# Patient Record
Sex: Female | Born: 1995 | Race: White | Hispanic: No | Marital: Single | Smoking: Current some day smoker
Health system: Southern US, Community
[De-identification: ages and names within clinical notes are randomized; demographics above are authoritative.]

## PROBLEM LIST (undated history)

## (undated) DIAGNOSIS — G43909 Migraine, unspecified, not intractable, without status migrainosus: Secondary | ICD-10-CM

## (undated) HISTORY — PX: TONSILLECTOMY: SUR1361

## (undated) HISTORY — PX: HERNIA REPAIR: SHX51

---
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
## 2015-11-12 ENCOUNTER — Emergency Department (HOSPITAL_COMMUNITY)
Admission: EM | Admit: 2015-11-12 | Discharge: 2015-11-12 | Disposition: A | Discharge status: Home / Self Care | Payer: Self-pay | Attending: Emergency Medicine | Admitting: Emergency Medicine

## 2015-11-12 ENCOUNTER — Encounter (HOSPITAL_COMMUNITY): Payer: Self-pay | Admitting: Emergency Medicine

## 2015-11-12 DIAGNOSIS — F17210 Nicotine dependence, cigarettes, uncomplicated: Secondary | ICD-10-CM | POA: Insufficient documentation

## 2015-11-12 DIAGNOSIS — M62830 Muscle spasm of back: Secondary | ICD-10-CM | POA: Insufficient documentation

## 2015-11-12 HISTORY — DX: Migraine, unspecified, not intractable, without status migrainosus: G43.909

## 2015-11-12 MED ORDER — CYCLOBENZAPRINE HCL 5 MG PO TABS: 5.0000 mg | ORAL_TABLET | Freq: Two times a day (BID) | ORAL | 0 refills | Status: AC | PRN

## 2015-11-12 MED ORDER — IBUPROFEN 600 MG PO TABS: 600.0000 mg | ORAL_TABLET | Freq: Four times a day (QID) | ORAL | 0 refills | Status: AC | PRN

## 2015-11-12 MED ORDER — METHOCARBAMOL 500 MG PO TABS: 500.0000 mg | ORAL_TABLET | Freq: Two times a day (BID) | ORAL | 0 refills | Status: DC

## 2015-11-12 NOTE — ED Notes (Signed)
NP IN TO TALK WITH PT AND MOTHER.

## 2015-11-12 NOTE — ED Notes (Signed)
PT STATES SHE HAS TRIED FLEXERIL BEFORE, "IT  DOESN'T HELP". MOTHER ASKING TO SPEAK TO NP AGAIN.

## 2015-11-12 NOTE — ED Provider Notes (Signed)
[facility]EMERGENCY DEPT Provider Note   CSN: [id number] Arrival date & time: 11/12/15  1032  By signing my name below, I, Freida Busman, attest that this documentation has been prepared under the direction and in the presence of non-physician practitioner, Teressa Lower, NP. Electronically Signed: Freida Busman, Scribe. 11/12/2015. 10:46 AM.   History   Chief Complaint Chief Complaint  Patient presents with  . Back Pain   The history is provided by the patient. No language interpreter was used.     HPI Comments:  Melanie Beasley is a 20 y.o. female who presents to the Emergency Department complaining of worsening left upper back pain x 2 days. She denies acute injury/fall. No alleviating factors noted. Pt has no other complaints or symptoms at this time. Pain is along the scapula and it hurts when she moves her shoulder. Denies numbness or weakness.    Past Medical History:  Diagnosis Date  . Migraines     There are no active problems to display for this patient.   Past Surgical History:  Procedure Laterality Date  . HERNIA REPAIR    . TONSILLECTOMY      OB History    No data available       Home Medications    Prior to Admission medications   Not on File    Family History No family history on file.  Social History Social History  Substance Use Topics  . Smoking status: Current Some Day Smoker    Types: Cigarettes  . Smokeless tobacco: Never Used  . Alcohol use No     Allergies   Review of patient's allergies indicates no known allergies.   Review of Systems Review of Systems  Constitutional: Negative for chills and fever.  Respiratory: Negative for shortness of breath.   Cardiovascular: Negative for chest pain.  Musculoskeletal: Positive for back pain.  All other systems reviewed and are negative.    Physical Exam Updated Vital Signs BP 112/67 (BP Location: Right Arm)   Pulse 72   Temp 98.6 F (37 C) (Oral)   Resp 16   LMP  11/05/2015   SpO2 100%   Physical Exam  Constitutional: She is oriented to person, place, and time. She appears well-developed and well-nourished. No distress.  HENT:  Head: Normocephalic and atraumatic.  Eyes: Conjunctivae are normal.  Cardiovascular: Normal rate.   Pulmonary/Chest: Effort normal.  Abdominal: She exhibits no distension.  Musculoskeletal:  Tender along the left scapula. Mild swelling noted. Pulses intact  Neurological: She is alert and oriented to person, place, and time.  Skin: Skin is warm and dry.  Psychiatric: She has a normal mood and affect.  Nursing note and vitals reviewed.    ED Treatments / Results  DIAGNOSTIC STUDIES:  Oxygen Saturation is 100% on RA, normal by my interpretation.    COORDINATION OF CARE:  10:46 AM Discussed treatment plan with pt at bedside and pt agreed to plan.  Labs (all labs ordered are listed, but only abnormal results are displayed) Labs Reviewed - No data to display  EKG  EKG Interpretation None       Radiology No results found.  Procedures Procedures (including critical care time)  Medications Ordered in ED Medications - No data to display   Initial Impression / Assessment and Plan / ED Course  I have reviewed the triage vital signs and the nursing notes.  Pertinent labs & imaging results that were available during my care of the patient were reviewed by me and  considered in my medical decision making (see chart for details).  Clinical Course   Will treat with muscle relaxer and ibuprofen. Don't think any imaging is needed at this time   Final Clinical Impressions(s) / ED Diagnoses   Final diagnoses:  Muscle spasm of back    New Prescriptions New Prescriptions   No medications on file    I personally performed the services described in this documentation, which was scribed in my presence. The recorded information has been reviewed and is accurate.     Teressa LowerVrinda Derrick Tiegs, NP 11/12/15 1130     Shaune Pollackameron Isaacs, MD 11/12/15 1753

## 2015-11-26 ENCOUNTER — Encounter (INDEPENDENT_AMBULATORY_CARE_PROVIDER_SITE_OTHER): Payer: Self-pay | Admitting: Sports Medicine

## 2015-11-26 ENCOUNTER — Ambulatory Visit (INDEPENDENT_AMBULATORY_CARE_PROVIDER_SITE_OTHER): Payer: Self-pay

## 2015-11-26 ENCOUNTER — Ambulatory Visit (INDEPENDENT_AMBULATORY_CARE_PROVIDER_SITE_OTHER): Payer: Self-pay | Admitting: Sports Medicine

## 2015-11-26 VITALS — BP 105/70 | HR 64 | Ht 60.0 in | Wt 109.0 lb

## 2015-11-26 DIAGNOSIS — G8929 Other chronic pain: Secondary | ICD-10-CM

## 2015-11-26 DIAGNOSIS — Z716 Tobacco abuse counseling: Secondary | ICD-10-CM

## 2015-11-26 DIAGNOSIS — Z720 Tobacco use: Secondary | ICD-10-CM

## 2015-11-26 DIAGNOSIS — M25512 Pain in left shoulder: Secondary | ICD-10-CM

## 2015-11-26 MED ORDER — DICLOFENAC SODIUM 75 MG PO TBEC: 75.0000 mg | DELAYED_RELEASE_TABLET | Freq: Two times a day (BID) | ORAL | 0 refills | Status: AC

## 2015-11-26 MED ORDER — FAMOTIDINE 20 MG PO TABS: 20.0000 mg | ORAL_TABLET | Freq: Two times a day (BID) | ORAL | 0 refills | Status: AC

## 2015-11-26 NOTE — Progress Notes (Signed)
Melanie Beasley - 20 y.o. female MRN [id number]  Date of birth: 1995/09/04  Office Visit Note: Visit Date: 11/26/2015 PCP: No primary care provider on file. Referred by: No ref. provider found  Assessment & Plan: Visit Diagnoses:  1. Chronic left shoulder pain   2. Tobacco abuse counseling   3. Tobacco abuse     Plan:   conservative management warranted initially with anti-inflammatories, left shoulder conditioning program & relative rest. If any lack of improvement will consider further diagnostic testing versus empiric subacromial injection. Option for physical therapy discussed. .    Patient Instructions  Please think about quitting smoking.  This is very important for your health.  There are many things we can do to help you quit.  Let  us know if you are interested.  You can also call 1-800-QUIT-NOW [phone](1-786-016-9748-669) for free smoking cessation counseling.   Do your shoulder exercises.    Meds & Orders:  Meds ordered this encounter  Medications  . diclofenac (VOLTAREN) 75 MG EC tablet    Sig: Take 1 tablet (75 mg total) by mouth 2 (two) times daily. Take 1 tab bid X 14 days then as needed    Dispense:  60 tablet    Refill:  0  . famotidine (PEPCID) 20 MG tablet    Sig: Take 1 tablet (20 mg total) by mouth 2 (two) times daily.    Dispense:  60 tablet    Refill:  0    Orders Placed This Encounter  Procedures  . XR Shoulder Left    Follow-up: Return in about 4 weeks (around 12/24/2015) for repeat clinical exam.   Procedures: No procedures performed  No notes on file   Clinical History: No additional findings.  She reports that she has been smoking Cigarettes.  She has never used smokeless tobacco.  . Subjective: Chief Complaint  Patient presents with  . Left Shoulder - reinjury   HPI: Pt. States a month ago she thinks she may have reinjuried her Left shoulder. Patient did go to [facility] 2weeks after, but no x-rays were taken.  Hurts to raise Left arm above her  head.  When she does move shoulder, feels like a sharp pain, when not moving shoulder, its a dull pain. No neck pain.    ROS Otherwise per HPI.  Objective:  VS:  HT:5' (152.4 cm)   WT:109 lb (49.4 kg)  BMI:21.3    BP:105/70  HR:64bpm  TEMP: ( )  RESP:  Physical Exam  Constitutional: No distress.  HENT:  Head: Normocephalic and atraumatic.  Eyes: Right eye exhibits no discharge. Left eye exhibits no discharge. No scleral icterus.  Pulmonary/Chest: Effort normal. No respiratory distress.  Neurological: She is alert.  Appropriately interactive.  Skin: Skin is warm and dry. No rash noted. She is not diaphoretic. No erythema. No pallor.  Psychiatric: Judgment normal.    Left Shoulder Exam   Other  Erythema: absent Scars: absent Sensation: normal   Comments:  Overall well aligned shoulder. Limited shoulder abduction by pain. Small amount of crepitation with axial loading & circumduction but is present bilaterally but focally over the anterior shoulder on the left. Some periscapular crepitation as well. Paraspinal muscle tenderness is present in the periscapular region. Negative speeds test, negative Yergason's tests. Internal rotation, external rotation & empty can testing strength is intact. No focal bony tenderness. Radial pulses & sensation bilateral upper extremities intact.     Imaging: Xr Shoulder Left  Result Date: 11/27/2015 Findings: Well  aligned shoulder without evidence of acute fracture dislocation. No obvious osseous lesions. Normal soft tissue. Impression: Normal appearing left shoulder.   Past Medical/Family/Surgical/Social History: There are no active problems to display for this patient.  Past Medical History:  Diagnosis Date  . Migraines    Family History  Problem Relation Age of Onset  . Cancer Mother   . Diabetes Father   . High blood pressure Father   . High blood pressure Maternal Grandmother   . Heart disease Maternal Grandmother    Past  Surgical History:  Procedure Laterality Date  . HERNIA REPAIR    . TONSILLECTOMY     Social History   Occupational History  . Not on file.   Social History Main Topics  . Smoking status: Current Some Day Smoker    Types: Cigarettes  . Smokeless tobacco: Never Used  . Alcohol use No  . Drug use: No  . Sexual activity: Not on file

## 2015-11-26 NOTE — Patient Instructions (Signed)
Please think about quitting smoking.  This is very important for your health.  There are many things we can do to help you quit.  Let  us know if you are interested.  You can also call 1-800-QUIT-NOW [phone]([phone]) for free smoking cessation counseling.   Do your shoulder exercises.

## 2015-12-24 ENCOUNTER — Ambulatory Visit (INDEPENDENT_AMBULATORY_CARE_PROVIDER_SITE_OTHER): Payer: Self-pay | Admitting: Sports Medicine

## 2016-03-02 ENCOUNTER — Ambulatory Visit (HOSPITAL_COMMUNITY)
Admission: EM | Admit: 2016-03-02 | Discharge: 2016-03-02 | Disposition: A | Discharge status: Home / Self Care | Payer: BLUE CROSS/BLUE SHIELD | Attending: Family Medicine | Admitting: Family Medicine

## 2016-03-02 ENCOUNTER — Encounter (HOSPITAL_COMMUNITY): Payer: Self-pay | Admitting: Emergency Medicine

## 2016-03-02 DIAGNOSIS — H6121 Impacted cerumen, right ear: Secondary | ICD-10-CM

## 2016-03-02 DIAGNOSIS — J069 Acute upper respiratory infection, unspecified: Secondary | ICD-10-CM

## 2016-03-02 DIAGNOSIS — B9789 Other viral agents as the cause of diseases classified elsewhere: Secondary | ICD-10-CM | POA: Diagnosis not present

## 2016-03-02 DIAGNOSIS — R110 Nausea: Secondary | ICD-10-CM

## 2016-03-02 MED ORDER — ONDANSETRON 4 MG PO TBDP: 4.0000 mg | ORAL_TABLET | Freq: Three times a day (TID) | ORAL | 0 refills | Status: AC | PRN

## 2016-03-02 NOTE — ED Provider Notes (Signed)
CSN: [id number]     Arrival date & time 03/02/16  1710 History   First MD Initiated Contact with Patient 03/02/16 1857     Chief Complaint  Patient presents with  . Dizziness  . Headache  . Nausea  . Cough   (Consider location/radiation/quality/duration/timing/severity/associated sxs/prior Treatment) 21 year old patient presents to clinic with chief complaint of dizziness, nausea, and cough. She also reports headache, denies fever, denies congestion, denies feelings of pain or pressure in her ears. No sore throat, has vomited once today but several times yesterday.   The history is provided by the patient.  Dizziness  Associated symptoms: headaches   Headache  Associated symptoms: cough and dizziness   Cough  Associated symptoms: headaches     Past Medical History:  Diagnosis Date  . Migraines    Past Surgical History:  Procedure Laterality Date  . HERNIA REPAIR    . TONSILLECTOMY     Family History  Problem Relation Age of Onset  . Cancer Mother   . Diabetes Father   . High blood pressure Father   . High blood pressure Maternal Grandmother   . Heart disease Maternal Grandmother    Social History  Substance Use Topics  . Smoking status: Current Some Day Smoker    Packs/day: 0.50    Types: Cigarettes  . Smokeless tobacco: Never Used  . Alcohol use No   OB History    No data available     Review of Systems  Reason unable to perform ROS: as covered in HPI.  Respiratory: Positive for cough.   Neurological: Positive for dizziness and headaches.  All other systems reviewed and are negative.   Allergies  Patient has no known allergies.  Home Medications   Prior to Admission medications   Medication Sig Start Date End Date Taking? Authorizing Provider  cyclobenzaprine (FLEXERIL) 5 MG tablet Take 1 tablet (5 mg total) by mouth 2 (two) times daily as needed for muscle spasms. Patient not taking: Reported on 11/26/2015 11/12/15   Teressa Lower, NP   diclofenac (VOLTAREN) 75 MG EC tablet Take 1 tablet (75 mg total) by mouth 2 (two) times daily. Take 1 tab bid X 14 days then as needed 11/26/15   Andrena Mews, DO  famotidine (PEPCID) 20 MG tablet Take 1 tablet (20 mg total) by mouth 2 (two) times daily. 11/26/15   Andrena Mews, DO  ibuprofen (ADVIL,MOTRIN) 600 MG tablet Take 1 tablet (600 mg total) by mouth every 6 (six) hours as needed. Patient not taking: Reported on 11/26/2015 11/12/15   Teressa Lower, NP  methocarbamol (ROBAXIN) 500 MG tablet Take 1 tablet (500 mg total) by mouth 2 (two) times daily. Patient not taking: Reported on 11/26/2015 11/12/15   Teressa Lower, NP  ondansetron (ZOFRAN ODT) 4 MG disintegrating tablet Take 1 tablet (4 mg total) by mouth every 8 (eight) hours as needed for nausea or vomiting. 03/02/16   Dorena Bodo, NP   Meds Ordered and Administered this Visit  Medications - No data to display  BP 114/82 (BP Location: Right Arm)   Pulse 78   Temp 98.2 F (36.8 C) (Oral)   LMP 01/29/2016 (Approximate)   SpO2 100%  No data found.   Physical Exam  Constitutional: She is oriented to person, place, and time. She appears well-developed and well-nourished. No distress.  HENT:  Head: Normocephalic and atraumatic.  Right Ear: External ear normal.  Left Ear: Tympanic membrane and external ear normal.  Nose: Nose  normal.  Mouth/Throat: Oropharynx is clear and moist.  Right cerumen impaction  Eyes: EOM are normal. Pupils are equal, round, and reactive to light.  Neck: Normal range of motion. Neck supple.  Cardiovascular: Normal rate and regular rhythm.   Pulmonary/Chest: Effort normal and breath sounds normal.  Abdominal: Soft. Bowel sounds are normal.  Lymphadenopathy:    She has no cervical adenopathy.  Neurological: She is alert and oriented to person, place, and time.  Skin: Skin is warm and dry. Capillary refill takes less than 2 seconds. She is not diaphoretic.  Psychiatric: She has a  normal mood and affect.  Nursing note and vitals reviewed.   Urgent Care Course     Procedures (including critical care time)  Labs Review Labs Reviewed - No data to display  Imaging Review No results found.   Visual Acuity Review  Right Eye Distance:   Left Eye Distance:   Bilateral Distance:    Right Eye Near:   Left Eye Near:    Bilateral Near:         MDM   1. Nausea   2. Impacted cerumen of right ear   3. Viral upper respiratory tract infection   You most likely have a viral URI, I advise rest, plenty of fluids and management of symptoms with over the counter medicines. For symptoms you may take Tylenol as needed every 4-6 hours for body aches or fever, not to exceed 4,000 mg a day, Take mucinex or mucinex DM ever 12 hours with a full glass of water, you may use an inhaled steroid such as Flonase, 2 sprays each nostril once a day for congestion, or an antihistamine such as Claritin or Zyrtec once a day. Should your symptoms worsen or fail to resolve, follow up with your primary care provider or return to clinic.   Your dizziness and nausea could be related to the cerumen impaction in your right ear. We have removed the impaction here in the office.  I have sent a prescription to your pharmacy for Zofran for nausea, take 1 tablet under the tongue every 8 hours as needed.    Dorena BodoLawrence Amanie Mcculley, NP 03/02/16 1928

## 2016-03-02 NOTE — Discharge Instructions (Signed)
You most likely have a viral URI, I advise rest, plenty of fluids and management of symptoms with over the counter medicines. For symptoms you may take Tylenol as needed every 4-6 hours for body aches or fever, not to exceed 4,000 mg a day, Take mucinex or mucinex DM ever 12 hours with a full glass of water, you may use an inhaled steroid such as Flonase, 2 sprays each nostril once a day for congestion, or an antihistamine such as Claritin or Zyrtec once a day. Should your symptoms worsen or fail to resolve, follow up with your primary care provider or return to clinic.   Your dizziness and nausea could be related to the cerumen impaction in your right ear. We have removed the impaction here in the office.  I have sent a prescription to your pharmacy for Zofran for nausea, take 1 tablet under the tongue every 8 hours as needed.

## 2016-03-02 NOTE — ED Triage Notes (Signed)
Pt has been suffering from a cough, dizziness and a headache for 2-3 days.  Yesterday she started feeling nauseous.  Pt denies a fever at home.

## 2016-10-13 ENCOUNTER — Encounter (HOSPITAL_COMMUNITY): Payer: Self-pay | Admitting: Emergency Medicine

## 2016-10-13 ENCOUNTER — Emergency Department (HOSPITAL_COMMUNITY)
Admission: EM | Admit: 2016-10-13 | Discharge: 2016-10-13 | Disposition: A | Discharge status: Home / Self Care | Payer: BLUE CROSS/BLUE SHIELD | Attending: Emergency Medicine | Admitting: Emergency Medicine

## 2016-10-13 DIAGNOSIS — F17210 Nicotine dependence, cigarettes, uncomplicated: Secondary | ICD-10-CM | POA: Diagnosis not present

## 2016-10-13 DIAGNOSIS — Y990 Civilian activity done for income or pay: Secondary | ICD-10-CM | POA: Insufficient documentation

## 2016-10-13 DIAGNOSIS — Y929 Unspecified place or not applicable: Secondary | ICD-10-CM | POA: Diagnosis not present

## 2016-10-13 DIAGNOSIS — Y939 Activity, unspecified: Secondary | ICD-10-CM | POA: Diagnosis not present

## 2016-10-13 DIAGNOSIS — X500XXA Overexertion from strenuous movement or load, initial encounter: Secondary | ICD-10-CM | POA: Insufficient documentation

## 2016-10-13 DIAGNOSIS — S46002A Unspecified injury of muscle(s) and tendon(s) of the rotator cuff of left shoulder, initial encounter: Secondary | ICD-10-CM

## 2016-10-13 DIAGNOSIS — S46092A Other injury of muscle(s) and tendon(s) of the rotator cuff of left shoulder, initial encounter: Secondary | ICD-10-CM | POA: Diagnosis not present

## 2016-10-13 DIAGNOSIS — S4982XA Other specified injuries of left shoulder and upper arm, initial encounter: Secondary | ICD-10-CM | POA: Diagnosis present

## 2016-10-13 DIAGNOSIS — Z79899 Other long term (current) drug therapy: Secondary | ICD-10-CM | POA: Insufficient documentation

## 2016-10-13 MED ORDER — TRAMADOL HCL 50 MG PO TABS: 50.0000 mg | ORAL_TABLET | Freq: Four times a day (QID) | ORAL | 0 refills | Status: DC | PRN

## 2016-10-13 MED ORDER — METHOCARBAMOL 500 MG PO TABS: 500.0000 mg | ORAL_TABLET | Freq: Two times a day (BID) | ORAL | 0 refills | Status: DC

## 2016-10-13 NOTE — Discharge Instructions (Signed)
Please read attached information. If you experience any new or worsening signs or symptoms please return to the emergency room for evaluation. Please follow-up with your primary care provider or specialist as discussed. Please use medication prescribed only as directed and discontinue taking if you have any concerning signs or symptoms.   °

## 2016-10-13 NOTE — ED Notes (Signed)
Pt able to ambulate to room, tearful when moving left arm. She reports she hurt her left arm at work yesterday lifting a heavy panel.

## 2016-10-13 NOTE — Progress Notes (Signed)
Orthopedic Tech Progress Note Patient Details:  Rondel OhJacklyne E Poirier [birth year] [id number]  Ortho Devices Type of Ortho Device: Arm sling Ortho Device/Splint Location: LUE Ortho Device/Splint Interventions: Ordered, Application   Jennye MoccasinHughes, Joedy Eickhoff Craig 10/13/2016, 1:24 PM

## 2016-10-13 NOTE — ED Notes (Signed)
Gave pt ice pack to apply to left shoulder.

## 2016-10-13 NOTE — ED Triage Notes (Signed)
Pt states she hurt her left shoulder. "I felt white hot pain into my shoulder when I picked up a pan."

## 2016-10-13 NOTE — ED Notes (Signed)
ED Provider at bedside. 

## 2016-10-13 NOTE — ED Provider Notes (Signed)
[facility]ER[facility]Y [facility]PT Provider Note   CSN: [id number] Arrival date & time: 10/13/16  1130   History   Chief Complaint Chief Complaint  Patient presents with  . Shoulder Pain    HPI Melanie Beasley is a 21 y.o. female.  HPI    21 year old female presents today with complaints of left shoulder pain.  Patient reports she was at work lifting heavy panels.  She notes a sharp pain in her left shoulder.  She notes pain with any range of motion, denies any loss of distal sensation or strength.  Patient notes she is unable to use the left upper extremity due to discomfort.  She denies any other injuries.  She notes previous history of shoulder injury.    Past Medical History:  Diagnosis Date  . Migraines     There are no active problems to display for this patient.   Past Surgical History:  Procedure Laterality Date  . HERNIA REPAIR    . TONSILLECTOMY      OB History    No data available       Home Medications    Prior to Admission medications   Medication Sig Start Date End Date Taking? Authorizing Provider  cyclobenzaprine (FLEXERIL) 5 MG tablet Take 1 tablet (5 mg total) by mouth 2 (two) times daily as needed for muscle spasms. Patient not taking: Reported on 11/26/2015 11/12/15   Teressa LowerPickering, Vrinda, NP  diclofenac (VOLTAREN) 75 MG EC tablet Take 1 tablet (75 mg total) by mouth 2 (two) times daily. Take 1 tab bid X 14 days then as needed 11/26/15   Andrena Mewsigby, Michael D, DO  famotidine (PEPCID) 20 MG tablet Take 1 tablet (20 mg total) by mouth 2 (two) times daily. 11/26/15   Andrena Mewsigby, Michael D, DO  ibuprofen (ADVIL,MOTRIN) 600 MG tablet Take 1 tablet (600 mg total) by mouth every 6 (six) hours as needed. Patient not taking: Reported on 11/26/2015 11/12/15   Teressa LowerPickering, Vrinda, NP  methocarbamol (ROBAXIN) 500 MG tablet Take 1 tablet (500 mg total) by mouth 2 (two) times daily. 10/13/16   Rickayla Wieland, Tinnie GensJeffrey, PA-C  ondansetron (ZOFRAN ODT) 4 MG disintegrating tablet Take 1 tablet (4 mg  total) by mouth every 8 (eight) hours as needed for nausea or vomiting. 03/02/16   Dorena BodoKennard, Lawrence, NP  traMADol (ULTRAM) 50 MG tablet Take 1 tablet (50 mg total) by mouth every 6 (six) hours as needed. 10/13/16   Eyvonne MechanicHedges, Ziyad Dyar, PA-C    Family History Family History  Problem Relation Age of Onset  . Cancer Mother   . Diabetes Father   . High blood pressure Father   . High blood pressure Maternal Grandmother   . Heart disease Maternal Grandmother     Social History Social History  Substance Use Topics  . Smoking status: Current Some Day Smoker    Packs/day: 0.50    Types: Cigarettes  . Smokeless tobacco: Never Used  . Alcohol use No     Allergies   Patient has no known allergies.   Review of Systems Review of Systems  All other systems reviewed and are negative.    Physical Exam Updated Vital Signs BP (!) 114/91 (BP Location: Right Arm)   Pulse 68   Temp 98.1 F (36.7 C) (Oral)   Resp 15   Ht 5' (1.524 m)   Wt 51.3 kg (113 lb)   LMP 09/01/2016   SpO2 100%   BMI 22.07 kg/m   Physical Exam  Constitutional: She is oriented to person,  place, and time. She appears well-developed and well-nourished.  HENT:  Head: Normocephalic and atraumatic.  Eyes: Pupils are equal, round, and reactive to light. Conjunctivae are normal. Right eye exhibits no discharge. Left eye exhibits no discharge. No scleral icterus.  Neck: Normal range of motion. No JVD present. No tracheal deviation present.  Pulmonary/Chest: Effort normal. No stridor.  Musculoskeletal:  Left shoulder atraumatic out swelling or edema.  Significant tenderness to palpation of the posterior shoulder and scapular region.  Reduced range of motion due to patient discomfort, worse with external rotation and flexion-radial pulse 2+, sensation intact, grip strength 5 out of 5  Neurological: She is alert and oriented to person, place, and time. Coordination normal.  Psychiatric: She has a normal mood and affect. Her  behavior is normal. Judgment and thought content normal.  Nursing note and vitals reviewed.    ED Treatments / Results  Labs (all labs ordered are listed, but only abnormal results are displayed) Labs Reviewed - No data to display  EKG  EKG Interpretation None      Radiology No results found.  Procedures Procedures (including critical care time)  Medications Ordered in ED Medications - No data to display   Initial Impression / Assessment and Plan / ED Course  I have reviewed the triage vital signs and the nursing notes.  Pertinent labs & imaging results that were available during my care of the patient were reviewed by me and considered in my medical decision making (see chart for details).     Final Clinical Impressions(s) / ED Diagnoses   Final diagnoses:  Injury of left rotator cuff, initial encounter    Discharge Meds: Ultram, Robaxin  Assessment/Plan: 21 year old female presents today with likely rotator cuff injury.  She has no neurological deficits decreased range of motion due to pain.  She will be given muscle relaxers, encouraged the anti-inflammatories, Ultram, sling, outpatient orthopedic follow-up.  Patient is given strict return precautions, she verbalized understanding and agreement to today's plan had no further questions or concerns the time discharge.   New Prescriptions New Prescriptions   METHOCARBAMOL (ROBAXIN) 500 MG TABLET    Take 1 tablet (500 mg total) by mouth 2 (two) times daily.   TRAMADOL (ULTRAM) 50 MG TABLET    Take 1 tablet (50 mg total) by mouth every 6 (six) hours as needed.     Eyvonne Mechanic, PA-C 10/13/16 1320    Gwyneth Sprout, MD 10/14/16 2046

## 2016-10-19 ENCOUNTER — Emergency Department (HOSPITAL_COMMUNITY): Payer: BLUE CROSS/BLUE SHIELD

## 2016-10-19 ENCOUNTER — Emergency Department (HOSPITAL_COMMUNITY)
Admission: EM | Admit: 2016-10-19 | Discharge: 2016-10-19 | Disposition: A | Discharge status: Home / Self Care | Payer: BLUE CROSS/BLUE SHIELD | Attending: Emergency Medicine | Admitting: Emergency Medicine

## 2016-10-19 ENCOUNTER — Encounter (HOSPITAL_COMMUNITY): Payer: Self-pay | Admitting: Emergency Medicine

## 2016-10-19 DIAGNOSIS — Y929 Unspecified place or not applicable: Secondary | ICD-10-CM | POA: Diagnosis not present

## 2016-10-19 DIAGNOSIS — M67912 Unspecified disorder of synovium and tendon, left shoulder: Secondary | ICD-10-CM

## 2016-10-19 DIAGNOSIS — S4992XA Unspecified injury of left shoulder and upper arm, initial encounter: Secondary | ICD-10-CM | POA: Diagnosis present

## 2016-10-19 DIAGNOSIS — X500XXA Overexertion from strenuous movement or load, initial encounter: Secondary | ICD-10-CM | POA: Insufficient documentation

## 2016-10-19 DIAGNOSIS — S46912A Strain of unspecified muscle, fascia and tendon at shoulder and upper arm level, left arm, initial encounter: Secondary | ICD-10-CM | POA: Diagnosis not present

## 2016-10-19 DIAGNOSIS — F17210 Nicotine dependence, cigarettes, uncomplicated: Secondary | ICD-10-CM | POA: Diagnosis not present

## 2016-10-19 DIAGNOSIS — Y9389 Activity, other specified: Secondary | ICD-10-CM | POA: Insufficient documentation

## 2016-10-19 DIAGNOSIS — Z79899 Other long term (current) drug therapy: Secondary | ICD-10-CM | POA: Diagnosis not present

## 2016-10-19 DIAGNOSIS — Y990 Civilian activity done for income or pay: Secondary | ICD-10-CM | POA: Insufficient documentation

## 2016-10-19 DIAGNOSIS — M75102 Unspecified rotator cuff tear or rupture of left shoulder, not specified as traumatic: Secondary | ICD-10-CM | POA: Insufficient documentation

## 2016-10-19 DIAGNOSIS — S46912D Strain of unspecified muscle, fascia and tendon at shoulder and upper arm level, left arm, subsequent encounter: Secondary | ICD-10-CM

## 2016-10-19 MED ORDER — OXYCODONE-ACETAMINOPHEN 5-325 MG PO TABS: 1.0000 | ORAL_TABLET | Freq: Four times a day (QID) | ORAL | 0 refills | Status: AC | PRN

## 2016-10-19 MED ORDER — OXYCODONE-ACETAMINOPHEN 5-325 MG PO TABS
1.0000 | ORAL_TABLET | Freq: Once | ORAL | Status: AC
  Administered 2016-10-19: 1 via ORAL
  Filled 2016-10-19: qty 1

## 2016-10-19 NOTE — Discharge Instructions (Signed)
Take ibuprofen for pain and Percocet only as needed for breakthrough severe pain. Continue with your muscle relaxer at night time. Remember not to drive or operate machinery while taking these medications. Wear your sling for comfort.   Follow-up up tomorrow with orthopedic at your scheduled appointment.   Return if symptoms worsen or new concerning symptoms in the meantime.

## 2016-10-19 NOTE — ED Notes (Signed)
Patient transported to X-ray 

## 2016-10-19 NOTE — ED Notes (Signed)
Patient did not want any pain medication in triage. Told patient to let staff person know if she changes her mind.

## 2016-10-19 NOTE — ED Provider Notes (Signed)
[facility]-EMERGENCY DEPT Provider Note   CSN: [id number] Arrival date & time: 10/19/16  1641     History   Chief Complaint Chief Complaint  Patient presents with  . Shoulder Injury    seen last week. Numbness and pain increased last night. See ortho tomorrow.    HPI Melanie Beasley is a 21 y.o. female presenting with left shoulder pain. 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 She was diagnosed with likely rotator cuff injury and referred to our toe and has an appointment tomorrow. She states that she was told to return if she experiences any numbness or worsening pain.  She has been experiencing shooting pains down her arm which have caused her fingers to feel numb intermittently. She also reports that when she wakes up in the morning her left fingers felt tingling for a moment and resolve. She denies any symptoms at this time but is uncomfortable and in pain. She reports scapular tenderness on the left and muscle tightness and spasm. He has taken tramadol without much relief and reports relief with muscle relaxants at nighttime.  HPI  Past Medical History:  Diagnosis Date  . Migraines     There are no active problems to display for this patient.   Past Surgical History:  Procedure Laterality Date  . HERNIA REPAIR    . TONSILLECTOMY      OB History    No data available       Home Medications    Prior to Admission medications   Medication Sig Start Date End Date Taking? Authorizing Provider  cyclobenzaprine (FLEXERIL) 5 MG tablet Take 1 tablet (5 mg total) by mouth 2 (two) times daily as needed for muscle spasms. Patient not taking: Reported on 11/26/2015 11/12/15   Teressa Lower, NP  diclofenac (VOLTAREN) 75 MG EC tablet Take 1 tablet (75 mg total) by mouth 2 (two) times daily. Take 1 tab bid X 14 days then as needed 11/26/15   Andrena Mews, DO  famotidine (PEPCID) 20 MG tablet Take 1 tablet (20 mg total) by mouth 2  (two) times daily. 11/26/15   Andrena Mews, DO  ibuprofen (ADVIL,MOTRIN) 600 MG tablet Take 1 tablet (600 mg total) by mouth every 6 (six) hours as needed. Patient not taking: Reported on 11/26/2015 11/12/15   Teressa Lower, NP  methocarbamol (ROBAXIN) 500 MG tablet Take 1 tablet (500 mg total) by mouth 2 (two) times daily. 10/13/16   Hedges, Tinnie Gens, PA-C  ondansetron (ZOFRAN ODT) 4 MG disintegrating tablet Take 1 tablet (4 mg total) by mouth every 8 (eight) hours as needed for nausea or vomiting. 03/02/16   Dorena Bodo, NP  oxyCODONE-acetaminophen (PERCOCET/ROXICET) 5-325 MG tablet Take 1 tablet by mouth every 6 (six) hours as needed for severe pain. 10/19/16   Georgiana Shore, PA-C  traMADol (ULTRAM) 50 MG tablet Take 1 tablet (50 mg total) by mouth every 6 (six) hours as needed. 10/13/16   Eyvonne Mechanic, PA-C    Family History Family History  Problem Relation Age of Onset  . Cancer Mother   . Diabetes Father   . High blood pressure Father   . High blood pressure Maternal Grandmother   . Heart disease Maternal Grandmother     Social History Social History  Substance Use Topics  . Smoking status: Current Some Day Smoker    Packs/day: 0.50    Types: Cigarettes  . Smokeless tobacco: Never Used  . Alcohol use No  Allergies   Patient has no known allergies.   Review of Systems Review of Systems  Respiratory: Negative for shortness of breath.   Cardiovascular: Negative for chest pain.  Gastrointestinal: Negative for nausea and vomiting.  Musculoskeletal: Positive for arthralgias and myalgias.  Skin: Negative for color change, pallor, rash and wound.  Neurological: Positive for numbness. Negative for dizziness and weakness.       Intermittent finger numbness with shooting pains and upon awakening in the morning. Now resolved     Physical Exam Updated Vital Signs BP 106/73 (BP Location: Right Arm)   Pulse (!) 104   Temp 98.6 F (37 C) (Oral)   Resp 17    Ht 5' (1.524 m)   Wt 50.8 kg (112 lb)   LMP 10/18/2016   SpO2 100%   BMI 21.87 kg/m   Physical Exam  Constitutional: She appears well-developed and well-nourished. No distress.  Afebrile, nontoxic-appearing, sitting still in chair with left arm in a sling in apparent discomfort.  HENT:  Head: Normocephalic and atraumatic.  Eyes: Conjunctivae are normal.  Neck: Normal range of motion. Neck supple.  Cardiovascular: Normal rate, regular rhythm, normal heart sounds and intact distal pulses.   No murmur heard. Pulmonary/Chest: Effort normal and breath sounds normal. No respiratory distress. She has no wheezes. She has no rales.  Musculoskeletal: She exhibits tenderness. She exhibits no edema.  Patient is able to perform slight rotation of the shoulder in a sling with discomfort. Swelling noted over the left trapezius muscle. Tender to palpation of the left scapula and the apex of the left pectoral muscle. Positive hawkins and empty can test. Difficulty with active flexion beyond 90 degree angle.  Neurological: She is alert. No sensory deficit.  5 out of 5 strength grips bilaterally, sensation intact, Neurovascularly intact distally  Skin: Skin is warm and dry. Capillary refill takes less than 2 seconds. No rash noted. She is not diaphoretic. No erythema. No pallor.  Psychiatric: She has a normal mood and affect.  Nursing note and vitals reviewed.    ED Treatments / Results  Labs (all labs ordered are listed, but only abnormal results are displayed) Labs Reviewed - No data to display  EKG  EKG Interpretation None       Radiology Dg Shoulder Left  Result Date: 10/19/2016 CLINICAL DATA:  Injury to left shoulder 1 week ago, heard a pop. EXAM: LEFT SHOULDER - 2+ VIEW COMPARISON:  None. FINDINGS: There is no evidence of fracture or dislocation. There is no evidence of arthropathy or other focal bone abnormality. Soft tissues are unremarkable. IMPRESSION: Negative. Electronically  Signed   By: Bary Richard M.D.   On: 10/19/2016 22:03    Procedures Procedures (including critical care time)  Medications Ordered in ED Medications  oxyCODONE-acetaminophen (PERCOCET/ROXICET) 5-325 MG per tablet 1 tablet (1 tablet Oral Given 10/19/16 2028)     Initial Impression / Assessment and Plan / ED Course  I have reviewed the triage vital signs and the nursing notes.  Pertinent labs & imaging results that were available during my care of the patient were reviewed by me and considered in my medical decision making (see chart for details).    Patient presenting with left shoulder pain and radiating shooting numbness in the left arm and finger tips intermitently. No numbness at this time. Reassuring exam, 5/5 strength to grips bilaterally and NVI distally. Findings consistent with rotator cuff injury.   Xray negative for fracture or dislocation. Will discharge home with analgesia  and close follow up with ortho tomorrow in office.  Discussed strict return precautions and advised to return to the emergency department if experiencing any new or worsening symptoms. Instructions were understood and patient agreed with discharge plan. Final Clinical Impressions(s) / ED Diagnoses   Final diagnoses:  Strain of left shoulder, subsequent encounter  Rotator cuff disorder, left    New Prescriptions New Prescriptions   OXYCODONE-ACETAMINOPHEN (PERCOCET/ROXICET) 5-325 MG TABLET    Take 1 tablet by mouth every 6 (six) hours as needed for severe pain.     Georgiana Shore, PA-C 10/19/16 2240    Marily Memos, MD 10/19/16 [phone]

## 2016-10-19 NOTE — ED Triage Notes (Signed)
Patient seen in ED a week ago, possible rotator cuff, left, injury. Told to return if pain and numbness increase. Went to work last night and came home crying due to pain. See's Ortho tomorrow.

## 2016-10-20 ENCOUNTER — Ambulatory Visit (INDEPENDENT_AMBULATORY_CARE_PROVIDER_SITE_OTHER): Payer: BLUE CROSS/BLUE SHIELD | Admitting: Orthopedic Surgery

## 2016-10-20 ENCOUNTER — Encounter (INDEPENDENT_AMBULATORY_CARE_PROVIDER_SITE_OTHER): Payer: Self-pay | Admitting: Orthopedic Surgery

## 2016-10-20 DIAGNOSIS — M25512 Pain in left shoulder: Secondary | ICD-10-CM | POA: Diagnosis not present

## 2016-10-20 MED ORDER — TRAMADOL HCL 50 MG PO TABS: 50.0000 mg | ORAL_TABLET | Freq: Three times a day (TID) | ORAL | 0 refills | Status: AC | PRN

## 2016-10-20 MED ORDER — METHOCARBAMOL 500 MG PO TABS: 500.0000 mg | ORAL_TABLET | Freq: Three times a day (TID) | ORAL | 0 refills | Status: DC | PRN

## 2016-10-20 MED ORDER — NAPROXEN 500 MG PO TABS: 500.0000 mg | ORAL_TABLET | Freq: Two times a day (BID) | ORAL | 0 refills | Status: AC

## 2016-10-20 NOTE — Progress Notes (Signed)
Office Visit Note   Patient: Melanie Beasley           Date of Birth: Sep 07, 1995           MRN: [id number] Visit Date: 10/20/2016 Requested by: No referring provider defined for this encounter. PCP: Patient, No Pcp Per  Subjective: Chief Complaint  Patient presents with  . Left Shoulder - Pain, Injury    HPI: Patient is a 21 year old female with left shoulder pain.  Date of injury 10/12/2016.  She works second shift.  She was picking up a cabinet and had a balanced on her left hip when a cabinet became unbalanced and she tried to catch the cabinet and she felt a pop in her left shoulder.  Unclear if there was a dislocation episode.  She localizes most of her pain to the latissimus region.  She went to a chiropractor who has not helped her.  She has been to the emergency room twice the injury.  She is wearing a sling.  She takes oxycodone from the emergency room with some relief.  She has been back to work but not doing that type of work.  She is a smoker.  Where she works she has to do some overhead work with lifting.  That would currently be impossible with her current pain situation.              ROS: All systems reviewed are negative as they relate to the chief complaint within the history of present illness.  Patient denies  fevers or chills.   Assessment & Plan: Visit Diagnoses: No diagnosis found.  Plan: Impression is left shoulder pain with most of her symptoms in the inferior aspect of the shoulder in the latissimus region.  Based on location of her symptoms this likely represents some type of partial muscle tear or pull.  I don't get a great history or exam for shoulder dislocation or rotator cuff pathology labral pathology may be more likely but at this point I think it is a muscle pull.  Under refill her Ultram Robaxin and naproxen.  Follow-up with me in 3 weeks.  We'll hold off on physical therapy until she is a little less painful but I do want her to discontinue the sling.   Repeat examination and possible repeat imaging at that time  Follow-Up Instructions: Return in about 3 weeks (around 11/10/2016).   Orders:  No orders of the defined types were placed in this encounter.  Meds ordered this encounter  Medications  . methocarbamol (ROBAXIN) 500 MG tablet    Sig: Take 1 tablet (500 mg total) by mouth every 8 (eight) hours as needed for muscle spasms.    Dispense:  30 tablet    Refill:  0  . naproxen (NAPROSYN) 500 MG tablet    Sig: Take 1 tablet (500 mg total) by mouth 2 (two) times daily with a meal.    Dispense:  50 tablet    Refill:  0  . traMADol (ULTRAM) 50 MG tablet    Sig: Take 1 tablet (50 mg total) by mouth every 8 (eight) hours as needed.    Dispense:  30 tablet    Refill:  0      Procedures: No procedures performed   Clinical Data: No additional findings.  Objective: Vital Signs: LMP 10/18/2016   Physical Exam:   Constitutional: Patient appears well-developed HEENT:  Head: Normocephalic Eyes:EOM are normal Neck: Normal range of motion Cardiovascular: Normal rate Pulmonary/chest: Effort normal  Neurologic: Patient is alert Skin: Skin is warm Psychiatric: Patient has normal mood and affect    Ortho Exam: Orthopedic exam demonstrates some guarding with attempted range of motion of the left shoulder.  Most of her tenderness is around the latissimus region.  No bruising is noted.  I don't detect a lot of coarseness or grinding with passive range of motion of the shoulder and the rotator cuff strength feels intact.  Can't really do an apprehension relocation exam due to pain.  No bruising noted in the shoulder girdle region.  No tenderness over the acromioclavicular joint.  The shoulder is located.  Specialty Comments:  No specialty comments available.  Imaging: Dg Shoulder Left  Result Date: 10/19/2016 CLINICAL DATA:  Injury to left shoulder 1 week ago, heard a pop. EXAM: LEFT SHOULDER - 2+ VIEW COMPARISON:  None. FINDINGS:  There is no evidence of fracture or dislocation. There is no evidence of arthropathy or other focal bone abnormality. Soft tissues are unremarkable. IMPRESSION: Negative. Electronically Signed   By: Bary Richard M.D.   On: 10/19/2016 22:03     PMFS History: There are no active problems to display for this patient.  Past Medical History:  Diagnosis Date  . Migraines     Family History  Problem Relation Age of Onset  . Cancer Mother   . Diabetes Father   . High blood pressure Father   . High blood pressure Maternal Grandmother   . Heart disease Maternal Grandmother     Past Surgical History:  Procedure Laterality Date  . HERNIA REPAIR    . TONSILLECTOMY     Social History   Occupational History  . Not on file.   Social History Main Topics  . Smoking status: Current Some Day Smoker    Packs/day: 0.50    Types: Cigarettes  . Smokeless tobacco: Never Used  . Alcohol use No  . Drug use: No  . Sexual activity: Not on file

## 2016-11-02 ENCOUNTER — Telehealth (INDEPENDENT_AMBULATORY_CARE_PROVIDER_SITE_OTHER): Payer: Self-pay

## 2016-11-02 NOTE — Telephone Encounter (Signed)
Faxed the 10/20/16 office note to case mgr per her request. Left vm for her to call me back to find out if [facility] claim has been accepted or if it is just under investigation at this point.

## 2016-11-10 ENCOUNTER — Encounter (INDEPENDENT_AMBULATORY_CARE_PROVIDER_SITE_OTHER): Payer: Self-pay | Admitting: Orthopedic Surgery

## 2016-11-10 ENCOUNTER — Ambulatory Visit (INDEPENDENT_AMBULATORY_CARE_PROVIDER_SITE_OTHER): Payer: BLUE CROSS/BLUE SHIELD | Admitting: Orthopedic Surgery

## 2016-11-10 DIAGNOSIS — M5412 Radiculopathy, cervical region: Secondary | ICD-10-CM

## 2016-11-10 MED ORDER — DICLOFENAC SODIUM 75 MG PO TBEC: DELAYED_RELEASE_TABLET | ORAL | 0 refills | Status: AC

## 2016-11-14 NOTE — Progress Notes (Signed)
Office Visit Note   Patient: Melanie Beasley           Date of Birth: 10/16/1995           MRN: [id number] Visit Date: 11/10/2016 Requested by: No referring provider defined for this encounter. PCP: Patient, No Pcp Per  Subjective: Chief Complaint  Patient presents with  . Left Shoulder - Follow-up    HPI: Patient is here to follow-up left shoulder and neck pain.  Date of injury 10/13/2016.  Decision point today was for or against further imaging.  Patient states she is not much better.  Still is very painful.  She is wearing a sling.  Reports occasional shooting pain into the neck with numbness and tingling into her forearm and hand.  Patient states she is "crying herself to sleep".  Ultram made her sick to her stomach.              ROS: All systems reviewed are negative as they relate to the chief complaint within the history of present illness.  Patient denies  fevers or chills.   Assessment & Plan: Visit Diagnoses:  1. Radiculopathy, cervical region     Plan: Impression is likely radicular pain from the neck radiating into the left shoulder.  She is having some pain in the shoulder blade region with numbness and tingling radiating into the forearm.  Plan is out of work for 3 weeks.  Diclofenac taper.  C-spine MRI to evaluate left-sided radiculopathy.  If that study is negative then we will look more towards intrinsic shoulder pathology but her history and examination today are more consistent with cervical radiculopathy  Follow-Up Instructions: Return for after MRI.   Orders:  Orders Placed This Encounter  Procedures  . MR Cervical Spine w/o contrast   Meds ordered this encounter  Medications  . diclofenac (VOLTAREN) 75 MG EC tablet    Sig: 1 po bid x 2 weeks the 1 po q d x 2 weeks    Dispense:  45 tablet    Refill:  0      Procedures: No procedures performed   Clinical Data: No additional findings.  Objective: Vital Signs: LMP 10/18/2016   Physical Exam:     Constitutional: Patient appears well-developed HEENT:  Head: Normocephalic Eyes:EOM are normal Neck: Normal range of motion Cardiovascular: Normal rate Pulmonary/chest: Effort normal Neurologic: Patient is alert Skin: Skin is warm Psychiatric: Patient has normal mood and affect    Ortho Exam: Orthopedic exam demonstrates somewhat limited range of motion of the neck with pain with rotation to the left.  Motor sensory function to the hand is intact.  Does have some paresthesias C6 distribution left compared to right.  Radial pulses intact bilaterally.  Not much in the way restricted passive range of motion left shoulder versus right.  Negative impingement signs on the left no acromioclavicular joint tenderness is noted on the left.  No other masses lymph adenopathy or skin changes noted in the left shoulder region  Specialty Comments:  No specialty comments available.  Imaging: No results found.   PMFS History: There are no active problems to display for this patient.  Past Medical History:  Diagnosis Date  . Migraines     Family History  Problem Relation Age of Onset  . Cancer Mother   . Diabetes Father   . High blood pressure Father   . High blood pressure Maternal Grandmother   . Heart disease Maternal Grandmother     Past  Surgical History:  Procedure Laterality Date  . HERNIA REPAIR    . TONSILLECTOMY     Social History   Occupational History  . Not on file.   Social History Main Topics  . Smoking status: Current Some Day Smoker    Packs/day: 0.50    Types: Cigarettes  . Smokeless tobacco: Never Used  . Alcohol use No  . Drug use: No  . Sexual activity: Not on file

## 2016-11-24 ENCOUNTER — Ambulatory Visit
Admission: RE | Admit: 2016-11-24 | Discharge: 2016-11-24 | Disposition: A | Discharge status: Home / Self Care | Payer: BLUE CROSS/BLUE SHIELD | Source: Ambulatory Visit | Attending: Orthopedic Surgery | Admitting: Orthopedic Surgery

## 2016-11-24 DIAGNOSIS — M5412 Radiculopathy, cervical region: Secondary | ICD-10-CM

## 2016-11-29 ENCOUNTER — Ambulatory Visit (INDEPENDENT_AMBULATORY_CARE_PROVIDER_SITE_OTHER): Payer: BLUE CROSS/BLUE SHIELD | Admitting: Orthopedic Surgery

## 2016-11-29 ENCOUNTER — Encounter (INDEPENDENT_AMBULATORY_CARE_PROVIDER_SITE_OTHER): Payer: Self-pay | Admitting: Orthopedic Surgery

## 2016-11-29 DIAGNOSIS — G8929 Other chronic pain: Secondary | ICD-10-CM

## 2016-11-29 DIAGNOSIS — M25512 Pain in left shoulder: Secondary | ICD-10-CM | POA: Diagnosis not present

## 2016-11-29 MED ORDER — METHOCARBAMOL 500 MG PO TABS: 500.0000 mg | ORAL_TABLET | Freq: Two times a day (BID) | ORAL | 0 refills | Status: AC | PRN

## 2016-11-30 NOTE — Progress Notes (Signed)
Office Visit Note   Patient: Melanie Beasley           Date of Birth: August 14, 1995           MRN: [id number] Visit Date: 11/29/2016 Requested by: No referring provider defined for this encounter. PCP: Patient, No Pcp Per  Subjective: Chief Complaint  Patient presents with  . Neck - Follow-up    HPI: Patient comes in after MRI C-spine.  She had an injury at work where she sustained radiating pain in the arm as well as pain in the left lateral latissimus region.  She was on Robaxin and dalteparin which is helping.  She reports numbness and tingling in the fingers.  Coughing hurts her.  She denies today in the shoulder blade pain.  She is now 6 weeks out from injury.  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  MRI cervical spine was essentially normal.  This is reviewed with the patient.  She does report continued shoulder pain as well localizing to the latissimus region she is not able to work in her physical job              ROS: All systems reviewed are negative as they relate to the chief complaint within the history of present illness.  Patient denies  fevers or chills.   Assessment & Plan: Visit Diagnoses:  1. Chronic left shoulder pain     Plan: Impression is left shoulder pain with normal MRI scan despite radicular symptoms.  She is unable to work.  Needs MRI arthrogram of the left shoulder to evaluate the latissimus insertion site.  Unlikely that we will find anything operative in the shoulder but her symptoms have been progressive and worsening over the last 6 weeks.  Out of work for 3 more weeks.  Refill Robaxin.  Follow-Up Instructions: Return for after MRI.   Orders:  Orders Placed This Encounter  Procedures  . MR SHOULDER LEFT W CONTRAST  . DG FLUORO GUIDED NEEDLE PLC ASPIRATION/INJECTION LOC   Meds ordered this encounter  Medications  . methocarbamol (ROBAXIN) 500 MG tablet    Sig: Take 1 tablet (500 mg total) by mouth 2 (two) times daily as needed for muscle spasms.    Dispense:  45 tablet    Refill:  0      Procedures: No procedures performed   Clinical Data: No additional findings.  Objective: Vital Signs: There were no vitals taken for this visit.  Physical Exam:   Constitutional: Patient appears well-developed HEENT:  Head: Normocephalic Eyes:EOM are normal Neck: Normal range of motion Cardiovascular: Normal rate Pulmonary/chest: Effort normal Neurologic: Patient is alert Skin: Skin is warm Psychiatric: Patient has normal mood and affect    Ortho Exam: Orthopedic exam demonstrates active and passive range of motion of the cervical spine without pain.  Left shoulder demonstrates good rotator cuff strength but with some pain around the quadrilateral space.  Rotator cuff strength is intact.  Negative apprehension relocation testing.  No other masses lymph adenopathy or skin changes noted in the shoulder girdle region on the left.  No scapular dyskinesia with forward flexion.  Specialty Comments:  No specialty comments available.  Imaging: No results found.   PMFS History: There are no active problems to display for this patient.  Past Medical History:  Diagnosis Date  . Migraines     Family History  Problem Relation Age of Onset  . Cancer Mother   . Diabetes Father   . High  blood pressure Father   . High blood pressure Maternal Grandmother   . Heart disease Maternal Grandmother     Past Surgical History:  Procedure Laterality Date  . HERNIA REPAIR    . TONSILLECTOMY     Social History   Occupational History  . Not on file.   Social History Main Topics  . Smoking status: Current Some Day Smoker    Packs/day: 0.50    Types: Cigarettes  . Smokeless tobacco: Never Used  . Alcohol use No  . Drug use: No  . Sexual activity: Not on file

## 2016-12-09 ENCOUNTER — Telehealth (INDEPENDENT_AMBULATORY_CARE_PROVIDER_SITE_OTHER): Payer: Self-pay | Admitting: Orthopedic Surgery

## 2016-12-09 NOTE — Telephone Encounter (Signed)
Patient was wondering about having the order for the MRI expanded to her back also for W/C case purposes. She feels like this might be a difficult case and would like all angles covered for full medical purposes. Patients # is [phone] and secondary # is [id number].

## 2016-12-10 NOTE — Telephone Encounter (Signed)
Tried calling patient back to discuss. No answer. [facility]. Was under the impression that her w/c case was denied.

## 2016-12-14 NOTE — Telephone Encounter (Signed)
Patient returned your call, she said she just now got your message. Call her back when possible to discuss case - [phone]  Or [phone].

## 2016-12-14 NOTE — Telephone Encounter (Signed)
Patient w/c was denied. Can you see about getting her shoulder arthrogram scheduled? Patient will be filling her personal insurance and the order is still in the chart from previous. If you need a new order, let me know. Thanks.

## 2016-12-14 NOTE — Telephone Encounter (Signed)
Y shuolder n back until rov

## 2016-12-14 NOTE — Telephone Encounter (Signed)
Patients w/c case was denied. She wants to proceed with the scan of her shoulder but file it with her personal insurance. She has obtained an attorney but would like to know if you would consider adding scan of her lumbar spine so she could have them done at the same time. I advised I would ask you but that we did not have any documentation to support reason of ordering and MRI of low back because we only have documentation of neck and shoulder pain with radicular symptoms. Please advise what you would like to do. Thanks.

## 2016-12-20 ENCOUNTER — Ambulatory Visit (INDEPENDENT_AMBULATORY_CARE_PROVIDER_SITE_OTHER): Payer: BLUE CROSS/BLUE SHIELD | Admitting: Orthopedic Surgery

## 2016-12-20 NOTE — Telephone Encounter (Signed)
appt is scheduled  

## 2017-01-03 ENCOUNTER — Ambulatory Visit
Admission: RE | Admit: 2017-01-03 | Discharge: 2017-01-03 | Disposition: A | Discharge status: Home / Self Care | Payer: BLUE CROSS/BLUE SHIELD | Source: Ambulatory Visit | Attending: Orthopedic Surgery | Admitting: Orthopedic Surgery

## 2017-01-03 DIAGNOSIS — M25512 Pain in left shoulder: Principal | ICD-10-CM

## 2017-01-03 DIAGNOSIS — G8929 Other chronic pain: Secondary | ICD-10-CM

## 2017-01-03 MED ORDER — IOPAMIDOL (ISOVUE-M 200) INJECTION 41%
20.0000 mL | Freq: Once | INTRAMUSCULAR | Status: AC
  Administered 2017-01-03: 20 mL via INTRA_ARTICULAR

## 2017-01-06 ENCOUNTER — Ambulatory Visit (INDEPENDENT_AMBULATORY_CARE_PROVIDER_SITE_OTHER): Payer: Self-pay | Admitting: Orthopedic Surgery

## 2017-01-06 ENCOUNTER — Encounter (INDEPENDENT_AMBULATORY_CARE_PROVIDER_SITE_OTHER): Payer: Self-pay | Admitting: Orthopedic Surgery

## 2017-01-06 DIAGNOSIS — M25512 Pain in left shoulder: Secondary | ICD-10-CM

## 2017-01-06 DIAGNOSIS — G8929 Other chronic pain: Secondary | ICD-10-CM

## 2017-01-07 ENCOUNTER — Encounter (INDEPENDENT_AMBULATORY_CARE_PROVIDER_SITE_OTHER): Payer: Self-pay | Admitting: Orthopedic Surgery

## 2017-01-07 NOTE — Progress Notes (Signed)
   Office Visit Note   Patient: Melanie Beasley           Date of Birth: [birth year]           MRN: [id number] Visit Date: 01/06/2017 Requested by: No referring provider defined for this encounter. PCP: Patient, No Pcp Per  Subjective: Chief Complaint  Patient presents with  . Left Shoulder - Follow-up    HPI: Melanie Beasley is.  Neck MRI scan fairly unremarkable since I have seen her she has had MRI arthrogram of the shoulder which is also normal.  Patient states she feels burning and liquid moving into her arm.  She is not sleeping well.  Today what can be done for her pain.  She is tried anti-inflammatories and muscle relaxers without much relief              ROS: All systems reviewed are negative as they relate to the chief complaint within the history of present illness.  Patient denies  fevers or chills.   Assessment & Plan: Visit Diagnoses:  1. Chronic left shoulder pain     Plan: Impression is no definite structural problem with surgical intervention.  Think she has had some kind of strain or sprain of some of the muscles involved in her upper movement she describes occurring at work.  [place] we will refer her to pain management.  Her main issue now is to be done I do not think it is a great idea for us has an orthopedic office to get involved in long-term opioid narcotic management for this problem which does not have a clear-cut surgical management algorithm.  We will refer her to pain management  Follow-Up Instructions: Return if symptoms worsen or fail to improve.   Orders:  Orders Placed This Encounter  Procedures  . Ambulatory referral to Pain Clinic   No orders of the defined types were placed in this encounter.     Procedures: No procedures performed   Clinical Data: No additional findings.  Objective: Vital Signs: There were no vitals taken for this visit.  Physical Exam:   Constitutional: Patient appears well-developed HEENT:  Head:  Normocephalic Eyes:EOM are normal Neck: Normal range of motion Cardiovascular: Normal rate Pulmonary/chest: Effort normal Neurologic: Patient is alert Skin: Skin is warm Psychiatric: Patient has normal mood and affect    Ortho Exam: Orthopedic examination is relatively unchanged.  No significant weakness in the hand.  Specialty Comments:  No specialty comments available.  Imaging: No results found.   PMFS History: There are no active problems to display for this patient.  Past Medical History:  Diagnosis Date  . Migraines     Family History  Problem Relation Age of Onset  . Cancer Mother   . Diabetes Father   . High blood pressure Father   . High blood pressure Maternal Grandmother   . Heart disease Maternal Grandmother     Past Surgical History:  Procedure Laterality Date  . HERNIA REPAIR    . TONSILLECTOMY     Social History   Occupational History  . Not on file  Tobacco Use  . Smoking status: Current Some Day Smoker    Packs/day: 0.50    Types: Cigarettes  . Smokeless tobacco: Never Used  Substance and Sexual Activity  . Alcohol use: No  . Drug use: No  . Sexual activity: Not on file

## 2017-01-12 ENCOUNTER — Telehealth (INDEPENDENT_AMBULATORY_CARE_PROVIDER_SITE_OTHER): Payer: Self-pay | Admitting: Orthopedic Surgery

## 2017-01-12 MED ORDER — BACLOFEN 10 MG PO TABS: 10.0000 mg | ORAL_TABLET | Freq: Two times a day (BID) | ORAL | 0 refills | Status: AC

## 2017-01-12 NOTE — Telephone Encounter (Signed)
Wanted to know if you could prescribe a muscle relaxer or something for her until she gets into pain management. Stated that now she is having an intense burning sensation in her neck and arm. Patients mom questions if we could refer her for EMG/NCV because she feels it could be a pinched nerve somewhere. Please advise. Thanks.

## 2017-01-12 NOTE — Addendum Note (Signed)
Addended byPrescott Parma: Nhi Butrum on: 01/12/2017 04:15 PM   Modules accepted: Orders

## 2017-01-12 NOTE — Telephone Encounter (Signed)
Ok for baclofen 10 mg po bid prn and no emg needed # 40

## 2017-01-12 NOTE — Telephone Encounter (Signed)
Patients mother called to check status of the referral to the pain clinic. I told her it looked like everything was in place and ready to be scheduled. Can you verify this for her because she said daughter is still in pain and has burning sensation and is unable to sleep. CB [phone]

## 2017-01-12 NOTE — Telephone Encounter (Signed)
Faxed to pharmacy. Tried calling to advise done and let them know Dr August Saucerean feels EMG/NCV not warranted at this time but no answer. LMVM

## 2019-10-01 IMAGING — MR MR CERVICAL SPINE W/O CM
4 of 6 series · 25 of 48 positions shown · non-contrast
Comparison: None available.

CLINICAL DATA: Initial evaluation for neck pain radiating into left
side with numbness in 3 fingers of left hand.

EXAM:
MRI CERVICAL SPINE WITHOUT CONTRAST
TECHNIQUE: Multiplanar, multisequence MR imaging of the cervical spine was
performed. No intravenous contrast was administered.

[Series 2: T2 · sagittal · 3.0mm · 0.41mm/px · 6 of 13 slices shown (1 of 3)]
[im 1/13]
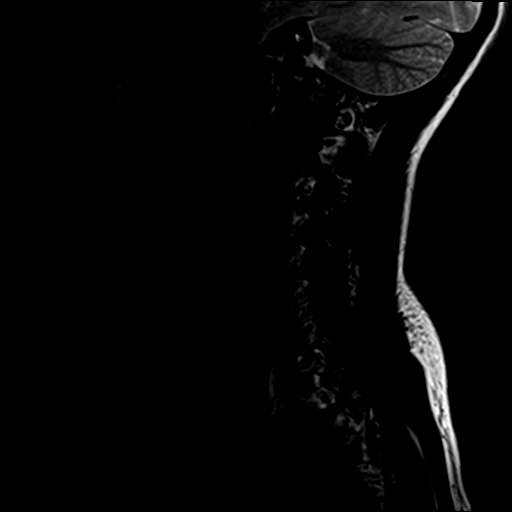
[im 3/13]
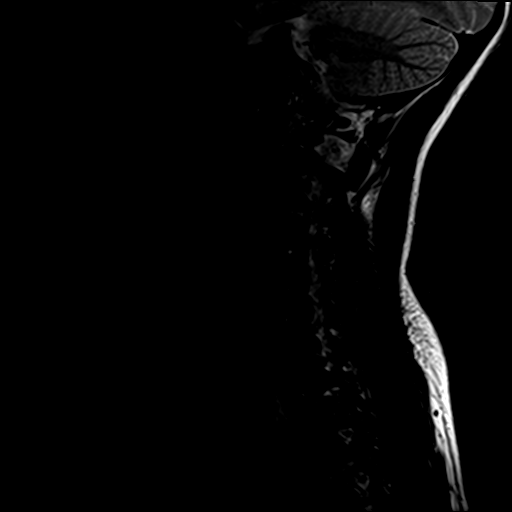
[im 5/13]
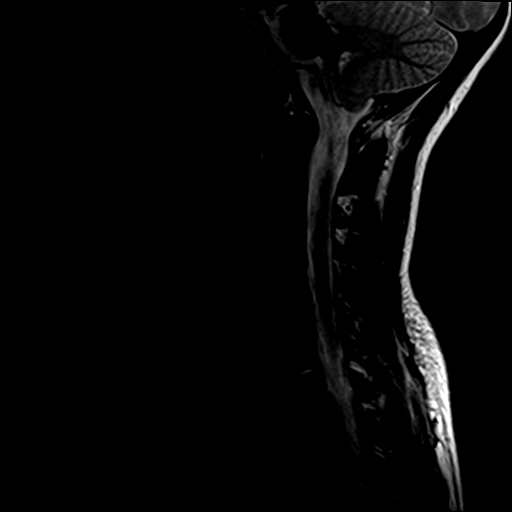
[im 8/13]
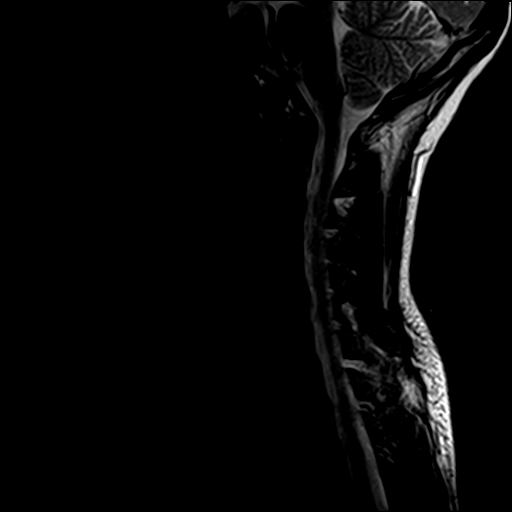
[im 10/13]
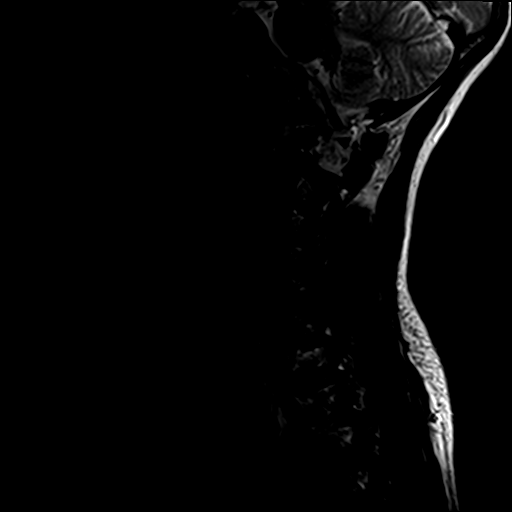
[im 13/13]
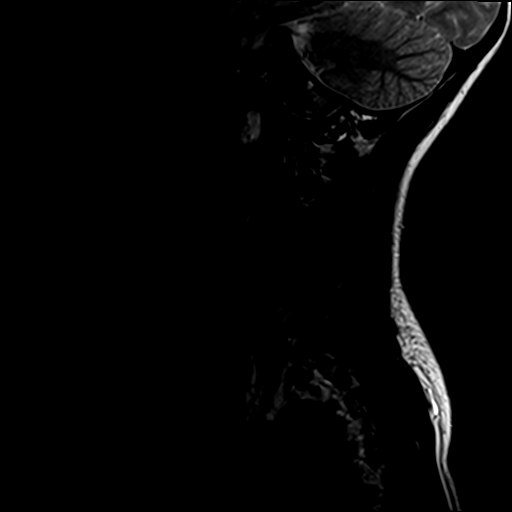

[Series 3: T1 · sagittal · 3.0mm · 0.41mm/px · 4 of 13 slices shown]
[im 1/13]
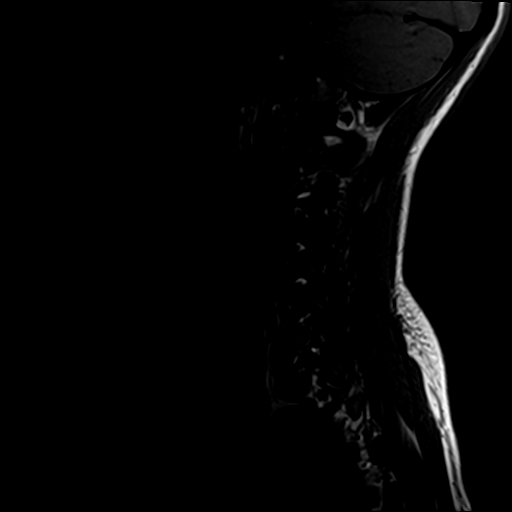
[im 3/13]
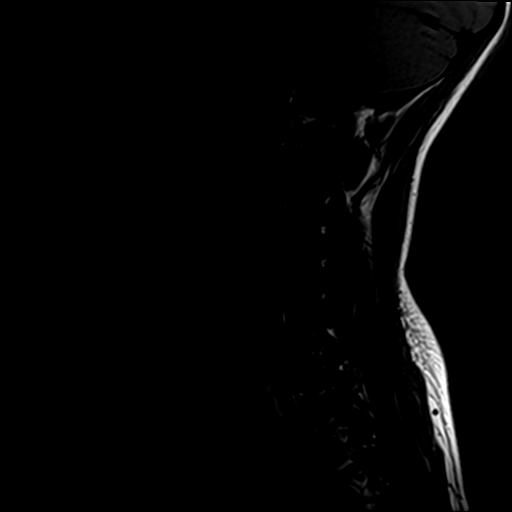
[im 8/13]
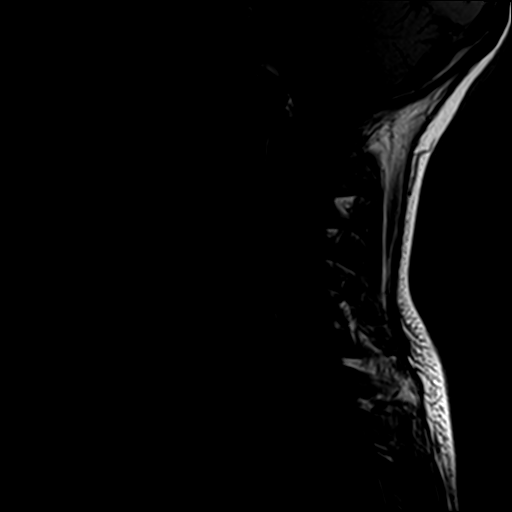
[im 13/13]
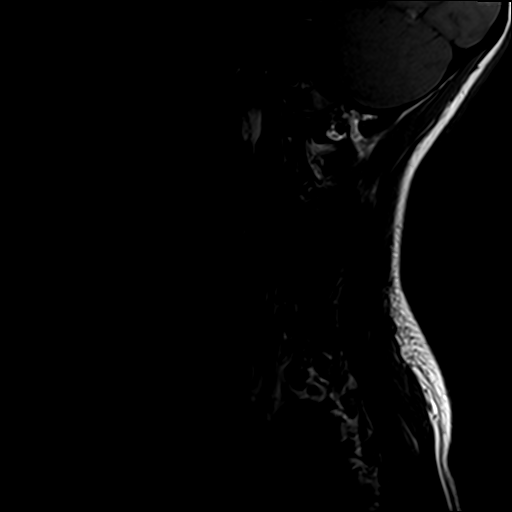

[Series 6: T2 · axial · 3.0mm · 0.70mm/px · z∈[-70,+22]mm · 9 of 26 slices shown (2 of 3)]
[im 1/26]
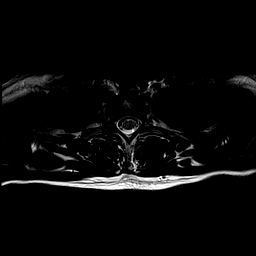
[im 5/26]
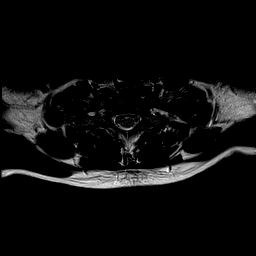
[im 7/26]
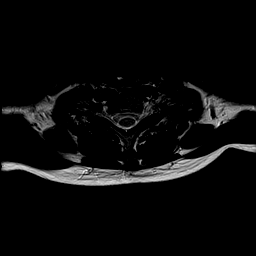
[im 12/26]
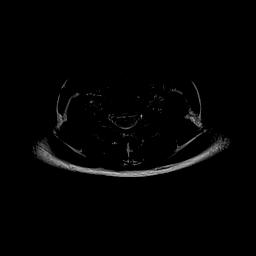
[im 14/26]
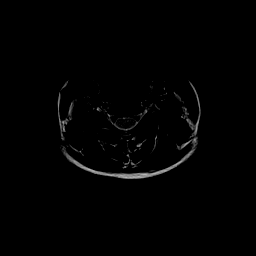
[im 19/26]
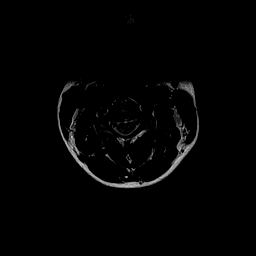
[im 21/26]
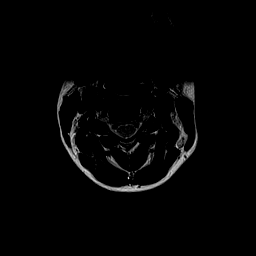
[im 23/26]
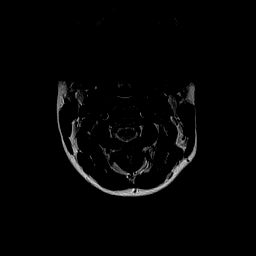
[im 26/26]
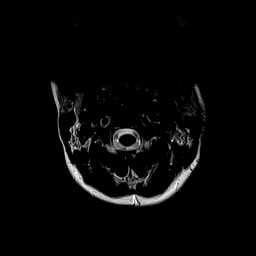

[Series 7: T2 · sagittal · 3.0mm · 0.41mm/px · 6 of 13 slices shown (3 of 3)]
[im 1/13]
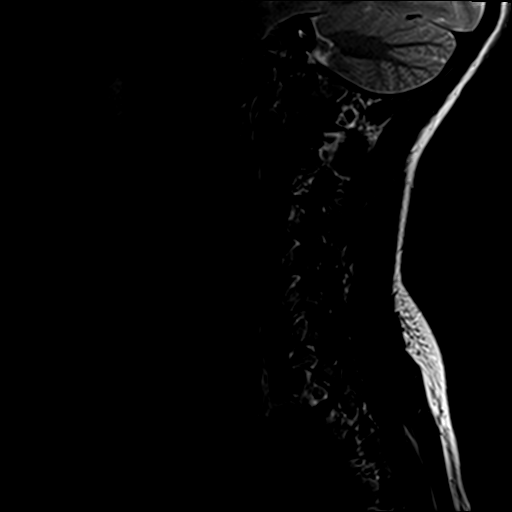
[im 3/13]
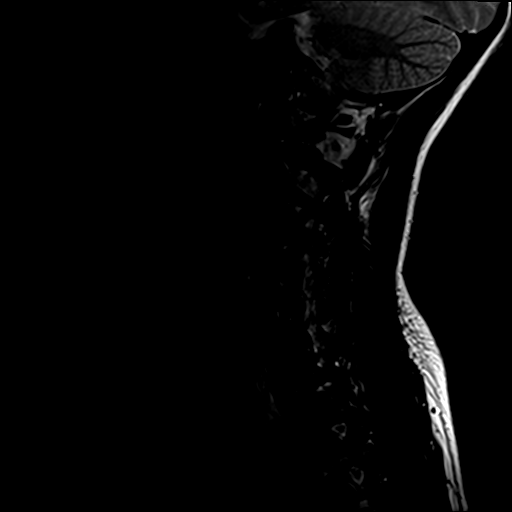
[im 5/13]
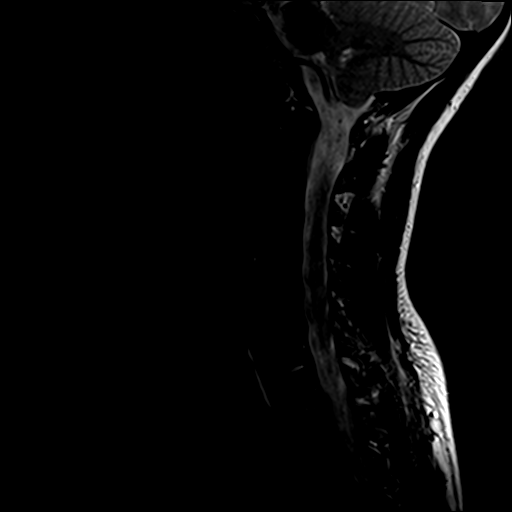
[im 8/13]
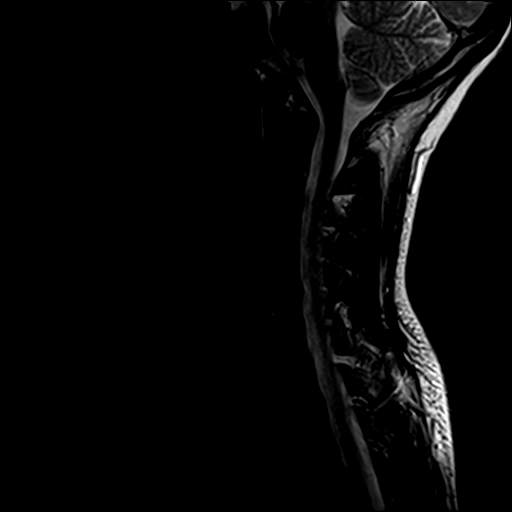
[im 10/13]
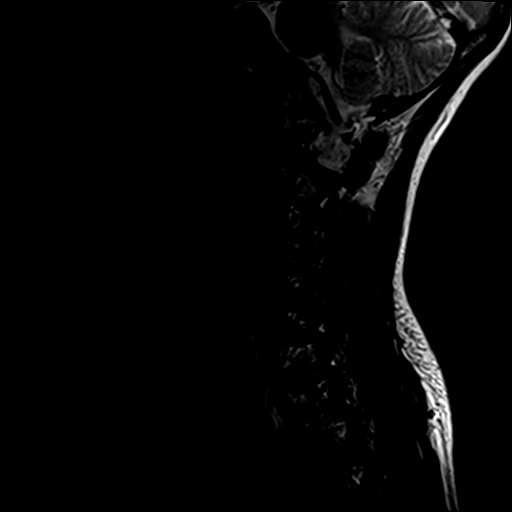
[im 13/13]
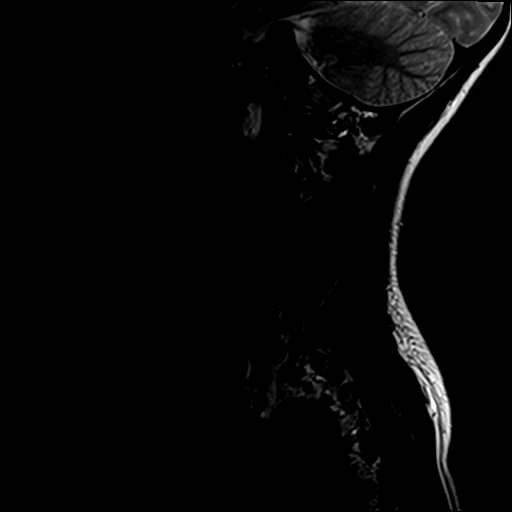

[25 of 48 positions shown; findings below may reference images not displayed]

FINDINGS: Alignment: Mild straightening of the normal cervical lordosis. No
listhesis.

Vertebrae: Vertebral body heights well maintained. No evidence for
acute or chronic fracture. Bone marrow signal intensity normal. No
discrete or worrisome osseous lesions. No abnormal marrow edema.

Cord: Signal intensity within the cervical spinal cord is normal.

Posterior Fossa, vertebral arteries, paraspinal tissues: Visualized
brain and posterior fossa within normal limits. Craniocervical
junction normal. Paraspinous soft tissues within normal limits.
Normal intravascular flow voids present within the vertebral
arteries bilaterally.

Disc levels:

C2-C3: Unremarkable.

C3-C4:  Unremarkable.

C4-C5:  Unremarkable.

C5-C6: Diffuse degenerative disc bulge with mild disc desiccation.
Bulging disc mildly flattens the ventral thecal sac without spinal
stenosis. Disc bulge extends into the neural foramina bilaterally
without significant foraminal encroachment.

C6-C7:  Unremarkable.

C7-T1:  Unremarkable.

Visualized upper thoracic spine within normal limits.
IMPRESSION: 1. Minor disc bulging at C5-6 without significant stenosis or neural
impingement.
2. Otherwise normal MRI of the cervical spine.
